# Patient Record
Sex: Male | Born: 1995 | Race: Black or African American | Hispanic: No | Marital: Single
Health system: Southern US, Community
[De-identification: ages and names within clinical notes are randomized; demographics above are authoritative.]

## PROBLEM LIST (undated history)

## (undated) DIAGNOSIS — K219 Gastro-esophageal reflux disease without esophagitis: Secondary | ICD-10-CM

---

## 2020-04-23 ENCOUNTER — Emergency Department (HOSPITAL_COMMUNITY)
Admission: EM | Admit: 2020-04-23 | Discharge: 2020-04-24 | Disposition: A | Discharge status: Home / Self Care | Payer: Self-pay | Attending: Emergency Medicine | Admitting: Emergency Medicine

## 2020-04-23 ENCOUNTER — Other Ambulatory Visit: Payer: Self-pay

## 2020-04-23 DIAGNOSIS — R1013 Epigastric pain: Secondary | ICD-10-CM | POA: Insufficient documentation

## 2020-04-23 DIAGNOSIS — R1011 Right upper quadrant pain: Secondary | ICD-10-CM

## 2020-04-23 DIAGNOSIS — N390 Urinary tract infection, site not specified: Secondary | ICD-10-CM | POA: Insufficient documentation

## 2020-04-23 LAB — COMPREHENSIVE METABOLIC PANEL
ALT: 12 U/L (ref 0–44)
AST: 19 U/L (ref 15–41)
Albumin: 4.6 g/dL (ref 3.5–5.0)
Alkaline Phosphatase: 64 U/L (ref 38–126)
Anion gap: 11 (ref 5–15)
BUN: 8 mg/dL (ref 6–20)
CO2: 26 mmol/L (ref 22–32)
Calcium: 9.8 mg/dL (ref 8.9–10.3)
Chloride: 102 mmol/L (ref 98–111)
Creatinine, Ser: 0.93 mg/dL (ref 0.61–1.24)
GFR calc Af Amer: >60 mL/min (ref >60)
GFR calc non Af Amer: >60 mL/min (ref >60)
Glucose, Bld: 85 mg/dL (ref 70–99)
Potassium: 3.4 mmol/L — ABNORMAL LOW (ref 3.5–5.1)
Sodium: 139 mmol/L (ref 135–145)
Total Bilirubin: 0.9 mg/dL (ref 0.3–1.2)
Total Protein: 7.5 g/dL (ref 6.5–8.1)

## 2020-04-23 LAB — CBC
HCT: 44.9 % (ref 39.0–52.0)
Hemoglobin: 13.7 g/dL (ref 13.0–17.0)
MCH: 29.3 pg (ref 26.0–34.0)
MCHC: 30.5 g/dL (ref 30.0–36.0)
MCV: 96.1 fL (ref 80.0–100.0)
Platelets: 287 10*3/uL (ref 150–400)
RBC: 4.67 MIL/uL (ref 4.22–5.81)
RDW: 11.8 % (ref 11.5–15.5)
WBC: 9.5 10*3/uL (ref 4.0–10.5)
nRBC: 0 % (ref 0.0–0.2)

## 2020-04-23 LAB — LIPASE, BLOOD: Lipase: 24 U/L (ref 11–51)

## 2020-04-23 NOTE — ED Triage Notes (Signed)
Pt presents for 4 days of abdominal pain. Denies n/v/d. Also wants to get checked for AIDS. Endorses itching on his penis.

## 2020-04-24 ENCOUNTER — Emergency Department (HOSPITAL_COMMUNITY): Payer: Self-pay

## 2020-04-24 LAB — HIV ANTIBODY (ROUTINE TESTING W REFLEX): HIV Screen 4th Generation wRfx: NONREACTIVE

## 2020-04-24 LAB — URINALYSIS, ROUTINE W REFLEX MICROSCOPIC
Bacteria, UA: NONE SEEN
Glucose, UA: NEGATIVE mg/dL
Hgb urine dipstick: NEGATIVE
Ketones, ur: 20 mg/dL — AB
Nitrite: NEGATIVE
Protein, ur: 100 mg/dL — AB
Specific Gravity, Urine: 1.034 — ABNORMAL HIGH (ref 1.005–1.030)
WBC, UA: >50 WBC/hpf — ABNORMAL HIGH (ref 0–5)
pH: 5 (ref 5.0–8.0)

## 2020-04-24 MED ORDER — CEPHALEXIN 500 MG PO CAPS: 500.0000 mg | ORAL_CAPSULE | Freq: Two times a day (BID) | ORAL | 0 refills | Status: AC

## 2020-04-24 MED ORDER — FAMOTIDINE 20 MG PO TABS: 20.0000 mg | ORAL_TABLET | Freq: Two times a day (BID) | ORAL | 0 refills | Status: AC

## 2020-04-24 MED ORDER — FAMOTIDINE 20 MG PO TABS
20.0000 mg | ORAL_TABLET | Freq: Once | ORAL | Status: AC
  Administered 2020-04-24: 20 mg via ORAL
  Filled 2020-04-24: qty 1

## 2020-04-24 MED ORDER — ALUM & MAG HYDROXIDE-SIMETH 200-200-20 MG/5ML PO SUSP
30.0000 mL | Freq: Once | ORAL | Status: AC
  Administered 2020-04-24: 30 mL via ORAL
  Filled 2020-04-24: qty 30

## 2020-04-24 NOTE — ED Notes (Signed)
Patient transported to [facility] 

## 2020-04-24 NOTE — ED Provider Notes (Signed)
[facility] EMERGENCY DEPARTMENT Provider Note   CSN: [id number] Arrival date & time: 04/23/20  1844     History Chief Complaint  Patient presents with  . Abdominal Pain    Jay Gallagher is a 24 y.o. male.  The history is provided by the patient. No language interpreter was used.  Abdominal Pain  Jay Gallagher is a 24 y.o. male who presents to the Emergency Department complaining of abdominal pain. He presents the emergency department complaining of four days of epigastric pain. Pain is constant in nature and worse with eating. At times it radiates to the right upper quadrant. He has mild associated nausea. Denies any vomiting, diarrhea, dysuria. He also reports itching in his upper thighs bilaterally. He requests testing for HIV. He also complains of swelling to his fingertips in both hands for a very long time. He denies any fevers, chest pain, shortness of breath. He does smoke tobacco. Denies any alcohol or drug use. He is not sexually active.    No past medical history on file.  There are no problems to display for this patient.        No family history on file.  Social History   Tobacco Use  . Smoking status: Not on file  Substance Use Topics  . Alcohol use: Not on file  . Drug use: Not on file    Home Medications Prior to Admission medications   Medication Sig Start Date End Date Taking? Authorizing Provider  cephALEXin (KEFLEX) 500 MG capsule Take 1 capsule (500 mg total) by mouth 2 (two) times daily. 04/24/20   Tilden Fossa, MD  famotidine (PEPCID) 20 MG tablet Take 1 tablet (20 mg total) by mouth 2 (two) times daily. 04/24/20   Tilden Fossa, MD    Allergies    Patient has no known allergies.  Review of Systems   Review of Systems  Gastrointestinal: Positive for abdominal pain.  All other systems reviewed and are negative.   Physical Exam Updated Vital Signs BP 124/78 (BP Location: Right Arm)   Pulse 60   Temp 98.3 F  (36.8 C) (Oral)   Resp 16   Ht 5\' 11"  (1.803 m)   Wt 74.8 kg   SpO2 100%   BMI 23.01 kg/m   Physical Exam Vitals and nursing note reviewed.  Constitutional:      Appearance: He is well-developed.  HENT:     Head: Normocephalic and atraumatic.  Cardiovascular:     Rate and Rhythm: Normal rate and regular rhythm.     Heart sounds: No murmur heard.   Pulmonary:     Effort: Pulmonary effort is normal. No respiratory distress.     Breath sounds: Normal breath sounds.  Abdominal:     Palpations: Abdomen is soft.     Tenderness: There is no guarding or rebound.     Comments: Mild to moderate epigastric and RUQ tenderness, negative murphy's sign  Genitourinary:    Comments: deferred Musculoskeletal:        General: No tenderness.     Comments: Clubbing to fingers  Skin:    General: Skin is warm and dry.  Neurological:     Mental Status: He is alert and oriented to person, place, and time.  Psychiatric:        Behavior: Behavior normal.     ED Results / Procedures / Treatments   Labs (all labs ordered are listed, but only abnormal results are displayed) Labs Reviewed  COMPREHENSIVE METABOLIC PANEL -  Abnormal; Notable for the following components:      Result Value   Potassium 3.4 (*)    All other components within normal limits  URINALYSIS, ROUTINE W REFLEX MICROSCOPIC - Abnormal; Notable for the following components:   Color, Urine AMBER (*)    APPearance CLOUDY (*)    Specific Gravity, Urine 1.034 (*)    Bilirubin Urine SMALL (*)    Ketones, ur 20 (*)    Protein, ur 100 (*)    Leukocytes,Ua MODERATE (*)    WBC, UA >50 (*)    All other components within normal limits  LIPASE, BLOOD  CBC  HIV ANTIBODY (ROUTINE TESTING W REFLEX)  URINALYSIS, ROUTINE W REFLEX MICROSCOPIC  RPR  GC/CHLAMYDIA PROBE AMP ([place]) NOT AT [facility]    EKG None  Radiology DG Chest 2 View  Result Date: 04/24/2020 CLINICAL DATA:  Epigastric pain.  Finger clubbing. EXAM: CHEST - 2  VIEW COMPARISON:  None. FINDINGS: The heart size and mediastinal contours are within normal limits. Both lungs are clear. The visualized skeletal structures are unremarkable. IMPRESSION: No active cardiopulmonary disease. Electronically Signed   By: Marlan Palau M.D.   On: 04/24/2020 11:31   US Abdomen Limited RUQ  Result Date: 04/24/2020 CLINICAL DATA:  Right upper quadrant abdominal pain for 4 days. EXAM: ULTRASOUND ABDOMEN LIMITED RIGHT UPPER QUADRANT COMPARISON:  None. FINDINGS: Gallbladder: No gallstones or wall thickening visualized. No sonographic Murphy sign noted by sonographer. Common bile duct: Diameter: 3 mm Liver: No focal lesion identified. Within normal limits in parenchymal echogenicity. Portal vein is patent on color Doppler imaging with normal direction of blood flow towards the liver. Other: None. IMPRESSION: Normal right upper quadrant ultrasound. Electronically Signed   By: Amie Portland M.D.   On: 04/24/2020 14:23    Procedures Procedures (including critical care time)  Medications Ordered in ED Medications  alum & mag hydroxide-simeth (MAALOX/MYLANTA) 200-200-20 MG/5ML suspension 30 mL (30 mLs Oral Given 04/24/20 1157)  famotidine (PEPCID) tablet 20 mg (20 mg Oral Given 04/24/20 1157)    ED Course  I have reviewed the triage vital signs and the nursing notes.  Pertinent labs & imaging results that were available during my care of the patient were reviewed by me and considered in my medical decision making (see chart for details).    MDM Rules/Calculators/A&P                         Pt here for evaluation of abdominal pain.  He is nontoxic appearing on exam with mild to moderate RUQ and epigastric tenderness, no peritoneal findings.  Will obtain RUQ [place] to r/o cholecystitis.  Presentation is not c/w appendicitis, pancreatitis, perforated viscus.    In terms of finger swelling - exam c/w clubbing, appears chronic.  He has no respiratory sxs.  Will obtain screening CXR.     Pt also with complaint of upper thigh/groin itching without rash.  He is not sexually active and declines GU exam - discussed treatment for jock itch.     UA consistent with UTI, will start antibiotics. Right upper quadrant ultrasound is negative for cholecystitis. On repeat assessment after G.I. cocktail and Pepcid patient's abdominal pain is resolved. Discussed with patient home care for gastritis as well as UTI. Discussed outpatient follow-up and return precautions.  Final Clinical Impression(s) / ED Diagnoses Final diagnoses:  RUQ abdominal pain  Epigastric pain  Acute UTI    Rx / DC Orders ED Discharge  Orders         Ordered    cephALEXin (KEFLEX) 500 MG capsule  2 times daily        04/24/20 1459    famotidine (PEPCID) 20 MG tablet  2 times daily        04/24/20 1459           Tilden Fossa, MD 04/24/20 1501

## 2020-04-24 NOTE — ED Notes (Signed)
Patient transported to x-ray. ?

## 2020-04-25 LAB — RPR: RPR Ser Ql: NONREACTIVE

## 2020-04-27 LAB — GC/CHLAMYDIA PROBE AMP (~~LOC~~) NOT AT ARMC
Chlamydia: NEGATIVE
Comment: NEGATIVE
Comment: NORMAL
Neisseria Gonorrhea: NEGATIVE

## 2021-03-15 IMAGING — CR DG CHEST 2V
2 series · 2 of 2 positions shown · non-contrast
Comparison: None.

CLINICAL DATA: Epigastric pain.  Finger clubbing.

EXAM:
CHEST - 2 VIEW

[chest pa]
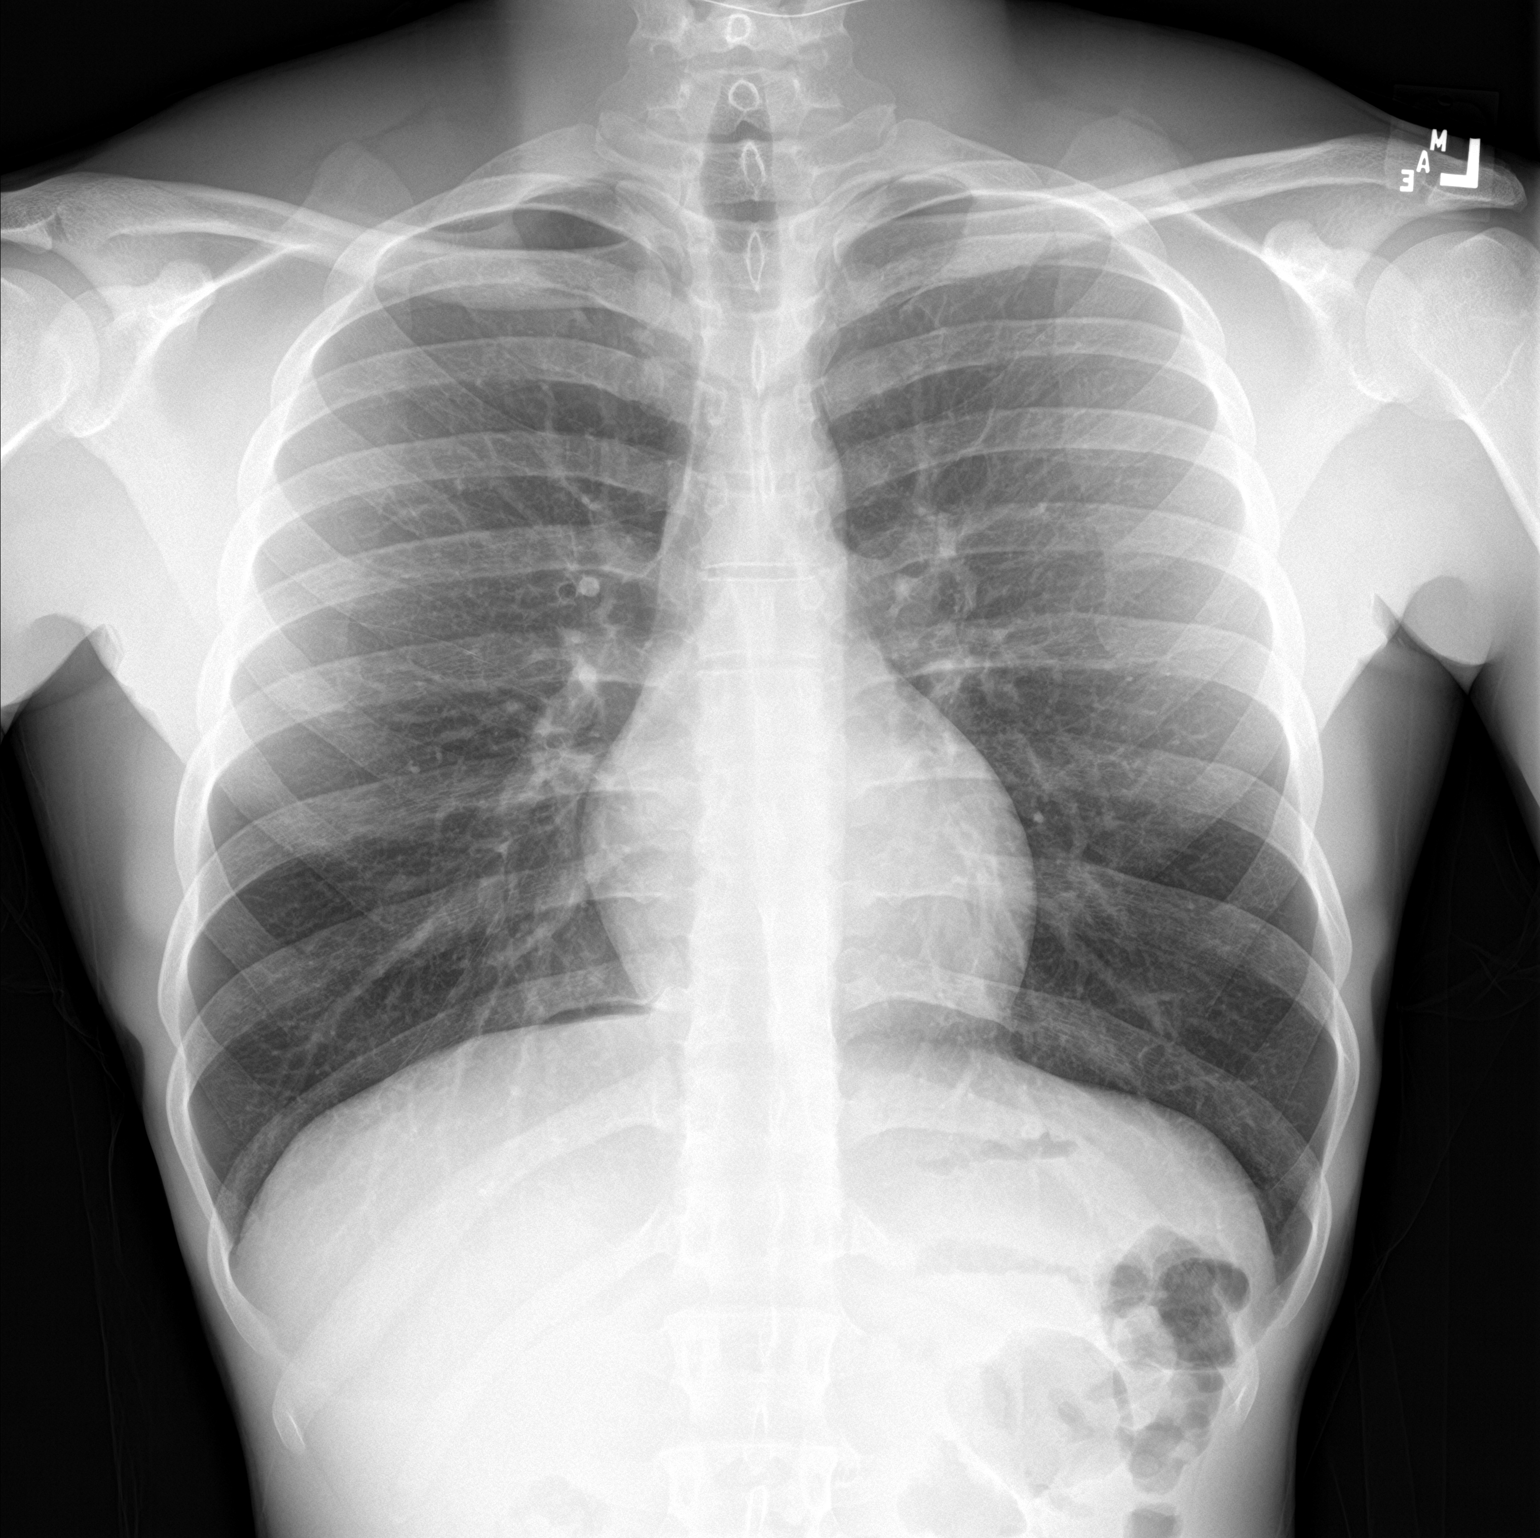

[chest lat]
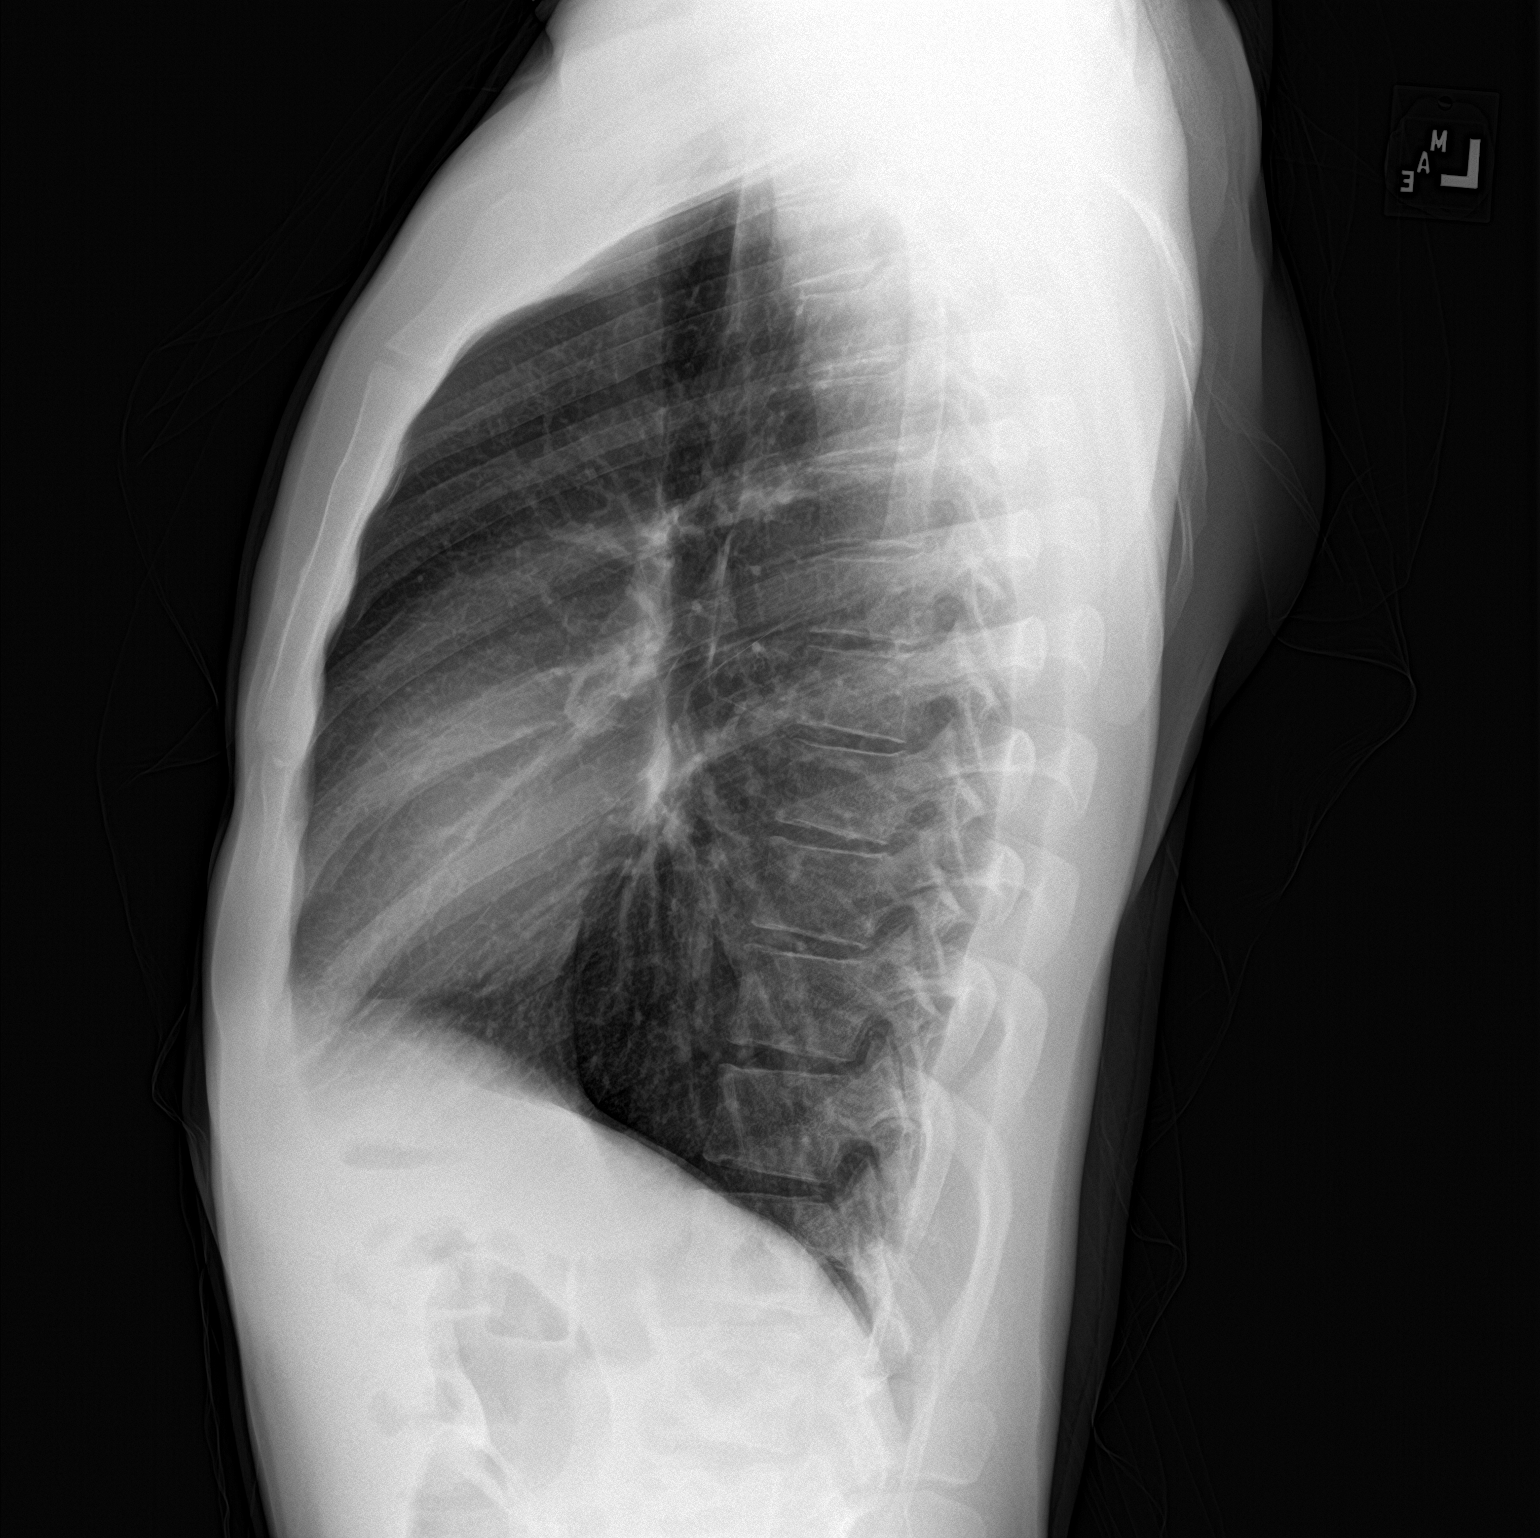

[2 of 2 positions shown; findings below may reference images not displayed]

FINDINGS: The heart size and mediastinal contours are within normal limits.
Both lungs are clear. The visualized skeletal structures are
unremarkable.
IMPRESSION: No active cardiopulmonary disease.

## 2021-03-15 IMAGING — US US ABDOMEN LIMITED
1 series · 14 of 25 positions shown · non-contrast
Comparison: None.

CLINICAL DATA: Right upper quadrant abdominal pain for 4 days.

EXAM:
ULTRASOUND ABDOMEN LIMITED RIGHT UPPER QUADRANT

[Series 1: abdomen us · 14 of 49 slices shown]
[im 1/49]
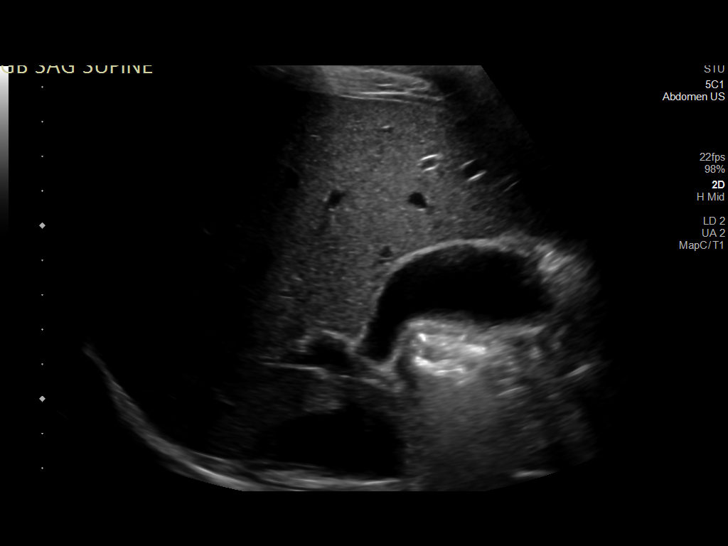
[im 5/49]
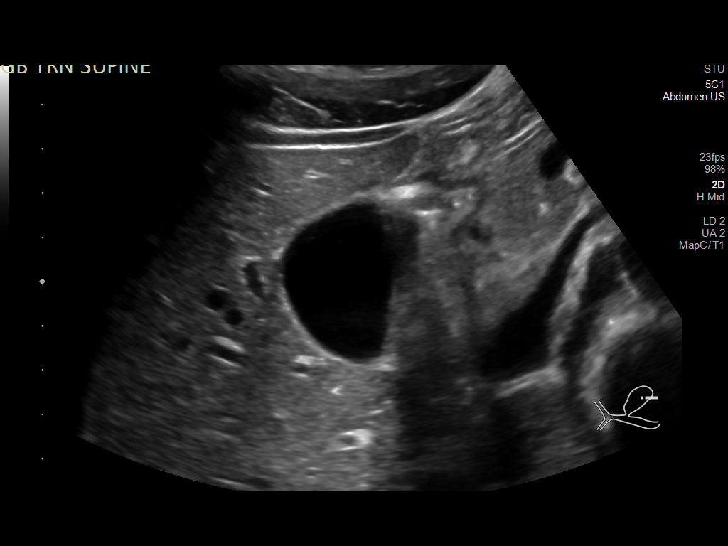
[im 9/49]
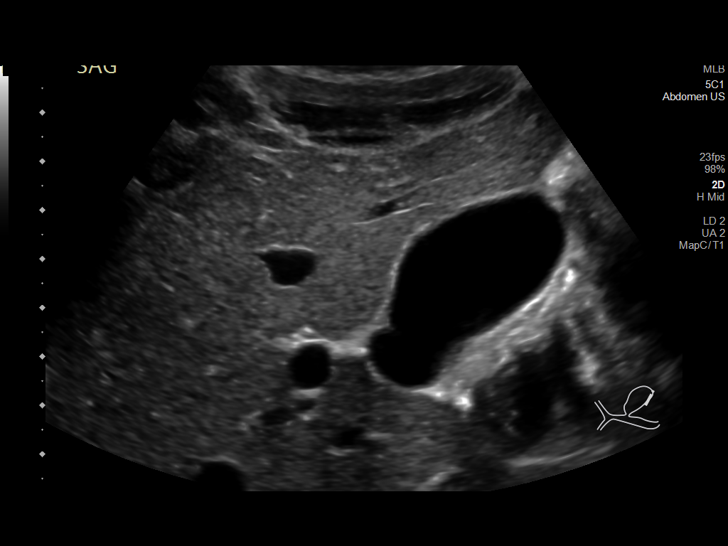
[im 13/49]
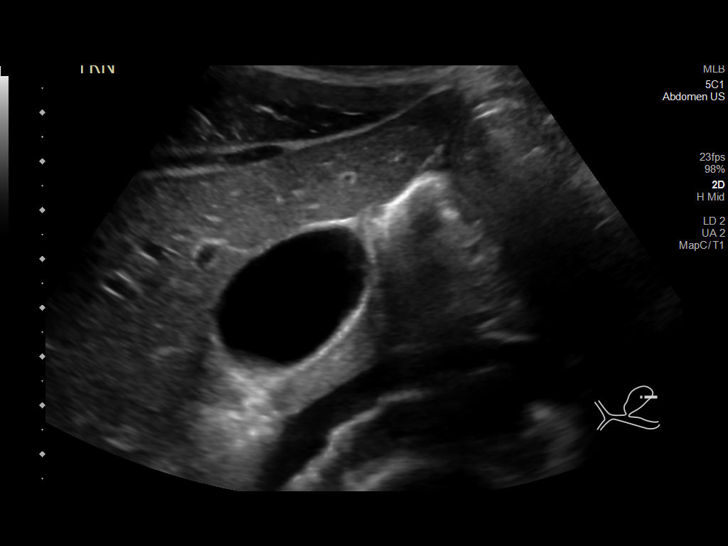
[im 17/49]
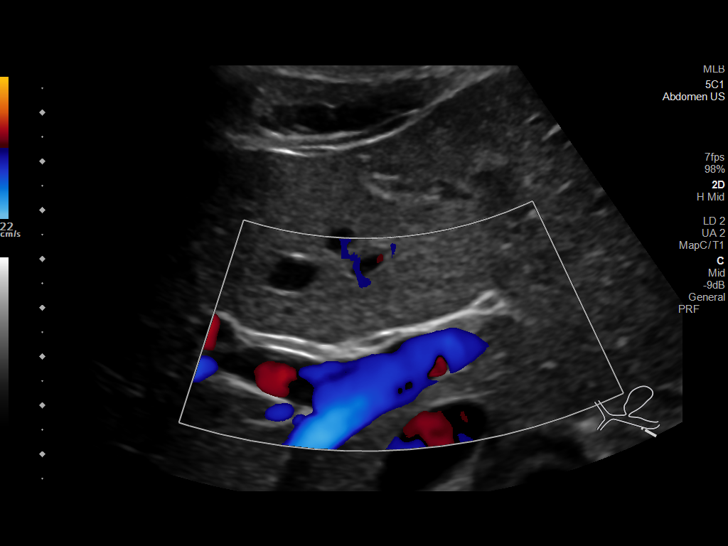
[im 19/49]
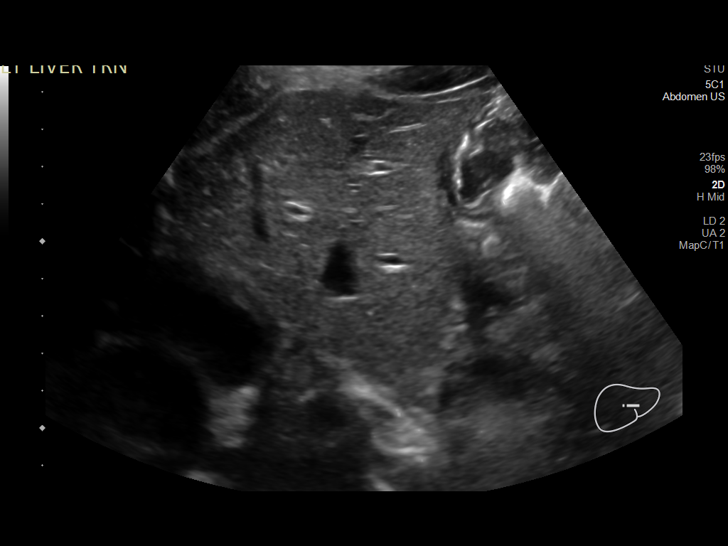
[im 23/49]
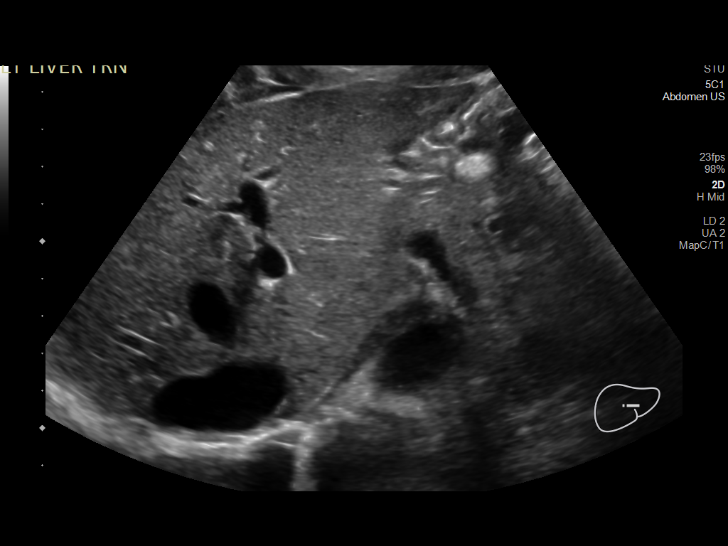
[im 27/49]
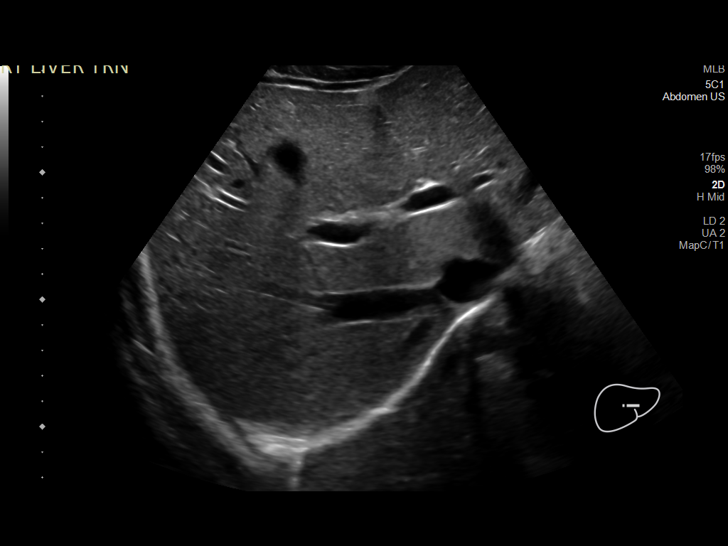
[im 31/49]
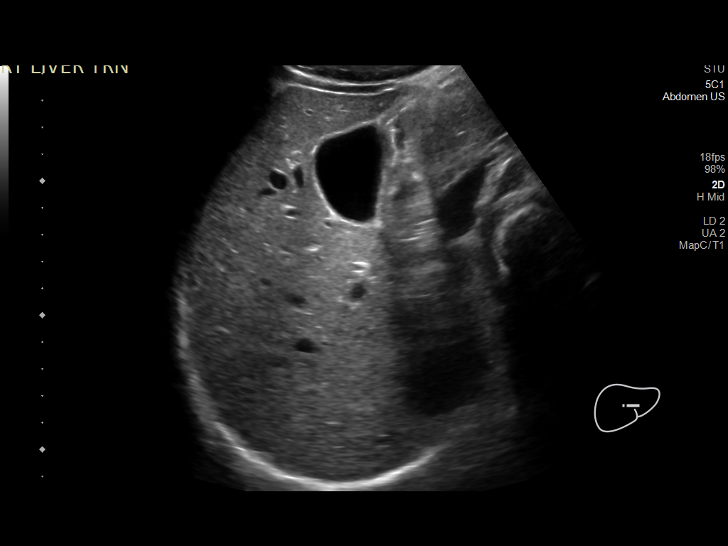
[im 33/49]
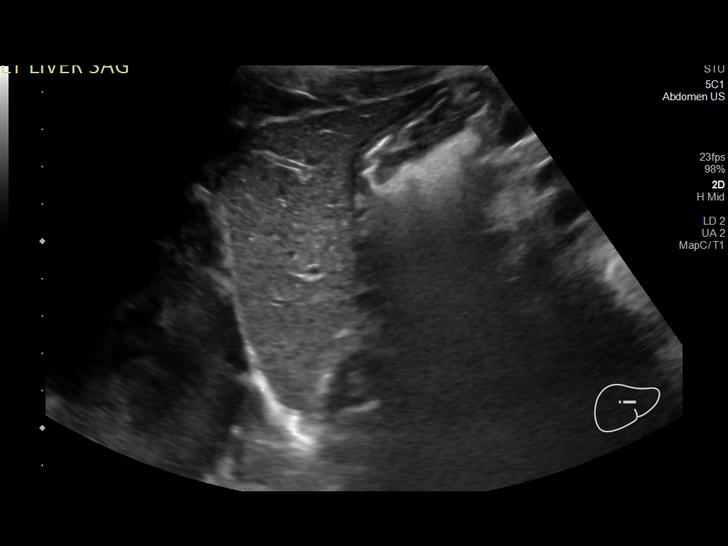
[im 37/49]
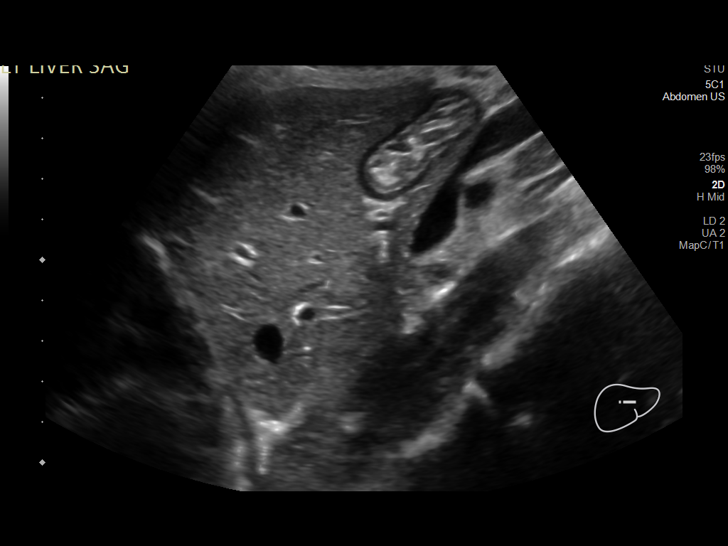
[im 41/49]
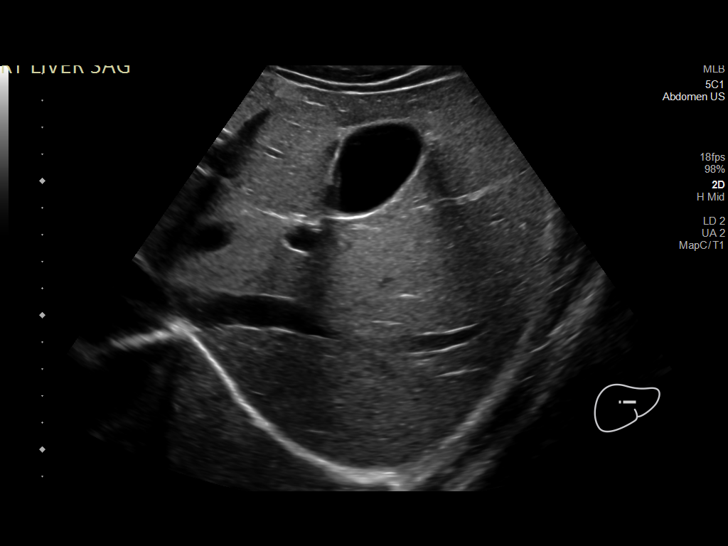
[im 45/49]
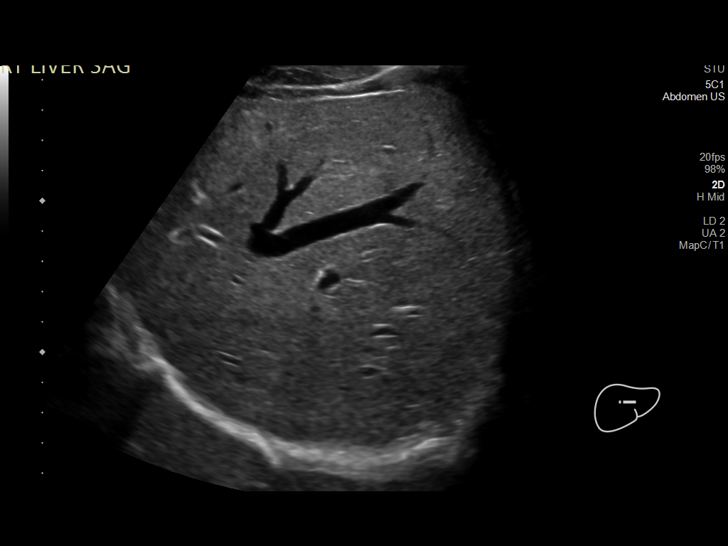
[im 49/49]
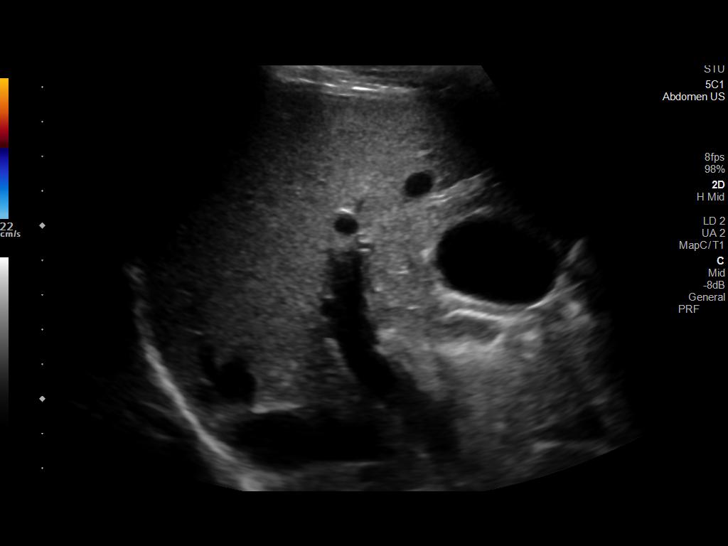

[14 of 25 positions shown; findings below may reference images not displayed]

FINDINGS: Gallbladder:

No gallstones or wall thickening visualized. No sonographic Murphy
sign noted by sonographer.

Common bile duct:

Diameter: 3 mm

Liver:

No focal lesion identified. Within normal limits in parenchymal
echogenicity. Portal vein is patent on color Doppler imaging with
normal direction of blood flow towards the liver.

Other: None.
IMPRESSION: Normal right upper quadrant ultrasound.

## 2021-12-01 ENCOUNTER — Encounter (HOSPITAL_COMMUNITY): Payer: Self-pay | Admitting: Certified Registered"

## 2021-12-01 ENCOUNTER — Other Ambulatory Visit (HOSPITAL_COMMUNITY): Payer: Self-pay | Admitting: Interventional Radiology

## 2021-12-01 ENCOUNTER — Encounter (HOSPITAL_COMMUNITY): Payer: Self-pay | Admitting: Interventional Radiology

## 2021-12-01 DIAGNOSIS — I6522 Occlusion and stenosis of left carotid artery: Secondary | ICD-10-CM

## 2021-12-01 NOTE — Progress Notes (Signed)
Unable to reach patient via phone.  Left a detailed message on machine with instructions for DOS. ? ?TWO VISITORS ARE ALLOWED TO COME WITH YOU AND STAY IN THE SURGICAL WAITING ROOM ONLY DURING PRE OP AND PROCEDURE DAY OF SURGERY.  ? ?Two VISITORS MAY VISIT WITH YOU AFTER SURGERY IN YOUR PRIVATE ROOM DURING VISITING HOURS ONLY! ? ?PCP - unknown ?Cardiologist - n/a ? ?Chest x-ray - n/a ?EKG - n/a ?Stress Test - n/a ?ECHO - n/a ?Cardiac Cath - n/a ? ?ICD Pacemaker/Loop - n/a ? ?Sleep Study -  n/a ?CPAP - none ? ?STOP now taking any Aspirin (unless otherwise instructed by your surgeon), Aleve, Naproxen, Ibuprofen, Motrin, Advil, Goody's, BC's, all herbal medications, fish oil, and all vitamins.  ?

## 2021-12-02 ENCOUNTER — Other Ambulatory Visit (HOSPITAL_COMMUNITY): Payer: Medicaid Other

## 2021-12-02 ENCOUNTER — Inpatient Hospital Stay (HOSPITAL_COMMUNITY)
Admission: RE | Admit: 2021-12-02 | Payer: Medicaid Other | Source: Ambulatory Visit | Admitting: Interventional Radiology

## 2021-12-02 ENCOUNTER — Encounter (HOSPITAL_COMMUNITY): Admission: RE | Payer: Self-pay | Source: Ambulatory Visit

## 2021-12-02 HISTORY — DX: Gastro-esophageal reflux disease without esophagitis: K21.9

## 2021-12-02 SURGERY — IR WITH ANESTHESIA
Anesthesia: General

## 2023-09-03 NOTE — ED Notes (Signed)
 Pt discharged with no issues. Discharge instructions reviewed with pt and verbalized understanding.  Encouraged to follow up with primary care provider.  Pt also encouraged to return to Emergency department if anything changes or issues arise.    Krystal CHRISTELLA Nettles, RN 09/03/23 0800
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
## 2023-09-03 NOTE — Progress Notes (Signed)
    Division of Pharmacy Services  Patient Name: Jay Gallagher  Age: 28 y.o.   [facility] ED  Allergies: Patient has no known allergies.  Admission History Admission Medication List was Completed  Medications removed from PTA medlist: n/a  Medication Documentation Review Audit     Reviewed by Charmaine Hoguet, CPhT (Pharmacy Technician) on 09/03/23 at [phone]  Med List Status: Pharm Tech complete           Audit from Redirected Encounters   **Prior to Admission medications have not yet been reviewed for this encounter**    Prior to Admission Medications     Reviewed by Charmaine Hoguet, CPhT on 09/03/23 at 0616    Medication Sig Last Dose Informant Taking? Status         No Medications to Display                       Audit from Redirected Encounters   **Prior to Admission medications have not yet been reviewed for this encounter**     Patients' primary pharmacy/pharmacies [facility] level in the medication list: [facility]  Comments: All medications and allergies verified by patient/patients family member-name who stated  patient is not taking any regular prescription or otc medications at home. Electronically signed by: Charmaine Hoguet, CPhT 09/03/2023 6:16 AM  [facility]PS enrolled for delivery. Please call [phone] with questions for patients bedded in [facility] and [phone] for all other locations. Electronically signed by: Charmaine Hoguet, CPhT 09/03/2023 6:16 AM

## 2024-02-02 ENCOUNTER — Encounter (HOSPITAL_COMMUNITY): Payer: Self-pay | Admitting: Interventional Radiology
